# Patient Record
Sex: Female | Born: 1970 | Race: Black or African American | Hispanic: No | Marital: Married | Smoking: Never smoker
Health system: Southern US, Community
[De-identification: ages and names within clinical notes are randomized; demographics above are authoritative.]

## PROBLEM LIST (undated history)

## (undated) DIAGNOSIS — I10 Essential (primary) hypertension: Secondary | ICD-10-CM

## (undated) HISTORY — PX: REDUCTION MAMMAPLASTY: SUR839

## (undated) HISTORY — PX: ECTOPIC PREGNANCY SURGERY: SHX613

## (undated) HISTORY — PX: CHOLECYSTECTOMY: SHX55

## (undated) HISTORY — PX: ABDOMINAL SURGERY: SHX537

## (undated) HISTORY — DX: Essential (primary) hypertension: I10

---

## 2009-02-05 ENCOUNTER — Encounter: Admission: RE | Admit: 2009-02-05 | Discharge: 2009-02-05 | Payer: Self-pay | Admitting: Specialist

## 2009-02-09 ENCOUNTER — Ambulatory Visit (HOSPITAL_COMMUNITY): Admission: RE | Admit: 2009-02-09 | Discharge: 2009-02-09 | Payer: Self-pay | Admitting: Obstetrics and Gynecology

## 2010-06-12 ENCOUNTER — Encounter: Payer: Self-pay | Admitting: Obstetrics and Gynecology

## 2011-02-16 ENCOUNTER — Other Ambulatory Visit (HOSPITAL_COMMUNITY): Payer: Self-pay | Admitting: Obstetrics and Gynecology

## 2011-02-16 DIAGNOSIS — Z1231 Encounter for screening mammogram for malignant neoplasm of breast: Secondary | ICD-10-CM

## 2011-02-28 ENCOUNTER — Ambulatory Visit (HOSPITAL_COMMUNITY)
Admission: RE | Admit: 2011-02-28 | Discharge: 2011-02-28 | Disposition: A | Discharge status: Home / Self Care | Payer: 59 | Source: Ambulatory Visit | Attending: Obstetrics and Gynecology | Admitting: Obstetrics and Gynecology

## 2011-02-28 DIAGNOSIS — Z1231 Encounter for screening mammogram for malignant neoplasm of breast: Secondary | ICD-10-CM | POA: Insufficient documentation

## 2011-08-23 DIAGNOSIS — Z0000 Encounter for general adult medical examination without abnormal findings: Secondary | ICD-10-CM

## 2012-01-29 ENCOUNTER — Other Ambulatory Visit: Payer: Self-pay | Admitting: Obstetrics and Gynecology

## 2012-01-29 DIAGNOSIS — Z1231 Encounter for screening mammogram for malignant neoplasm of breast: Secondary | ICD-10-CM

## 2012-02-29 ENCOUNTER — Ambulatory Visit (HOSPITAL_COMMUNITY)
Admission: RE | Admit: 2012-02-29 | Discharge: 2012-02-29 | Disposition: A | Discharge status: Home / Self Care | Payer: 59 | Source: Ambulatory Visit | Attending: Obstetrics and Gynecology | Admitting: Obstetrics and Gynecology

## 2012-02-29 DIAGNOSIS — Z1231 Encounter for screening mammogram for malignant neoplasm of breast: Secondary | ICD-10-CM | POA: Insufficient documentation

## 2012-03-04 ENCOUNTER — Other Ambulatory Visit: Payer: Self-pay | Admitting: Obstetrics and Gynecology

## 2012-03-04 DIAGNOSIS — R928 Other abnormal and inconclusive findings on diagnostic imaging of breast: Secondary | ICD-10-CM

## 2012-03-08 ENCOUNTER — Ambulatory Visit
Admission: RE | Admit: 2012-03-08 | Discharge: 2012-03-08 | Disposition: A | Discharge status: Home / Self Care | Payer: 59 | Source: Ambulatory Visit | Attending: Obstetrics and Gynecology | Admitting: Obstetrics and Gynecology

## 2012-03-08 DIAGNOSIS — R928 Other abnormal and inconclusive findings on diagnostic imaging of breast: Secondary | ICD-10-CM

## 2013-02-17 ENCOUNTER — Other Ambulatory Visit: Payer: Self-pay | Admitting: Obstetrics and Gynecology

## 2013-02-17 DIAGNOSIS — Z1231 Encounter for screening mammogram for malignant neoplasm of breast: Secondary | ICD-10-CM

## 2013-03-10 ENCOUNTER — Ambulatory Visit (HOSPITAL_COMMUNITY): Payer: 59

## 2013-03-21 ENCOUNTER — Ambulatory Visit (HOSPITAL_COMMUNITY)
Admission: RE | Admit: 2013-03-21 | Discharge: 2013-03-21 | Disposition: A | Discharge status: Home / Self Care | Payer: 59 | Source: Ambulatory Visit | Attending: Obstetrics and Gynecology | Admitting: Obstetrics and Gynecology

## 2013-03-21 DIAGNOSIS — Z1231 Encounter for screening mammogram for malignant neoplasm of breast: Secondary | ICD-10-CM | POA: Insufficient documentation

## 2013-06-18 ENCOUNTER — Other Ambulatory Visit (HOSPITAL_COMMUNITY): Payer: Self-pay | Admitting: Orthopedic Surgery

## 2013-06-18 DIAGNOSIS — M25562 Pain in left knee: Secondary | ICD-10-CM

## 2013-07-01 ENCOUNTER — Ambulatory Visit (HOSPITAL_COMMUNITY)
Admission: RE | Admit: 2013-07-01 | Discharge: 2013-07-01 | Disposition: A | Discharge status: Home / Self Care | Payer: 59 | Source: Ambulatory Visit | Attending: Orthopedic Surgery | Admitting: Orthopedic Surgery

## 2013-07-01 DIAGNOSIS — M7120 Synovial cyst of popliteal space [Baker], unspecified knee: Secondary | ICD-10-CM | POA: Insufficient documentation

## 2013-07-01 DIAGNOSIS — M25562 Pain in left knee: Secondary | ICD-10-CM

## 2013-07-01 DIAGNOSIS — M25469 Effusion, unspecified knee: Secondary | ICD-10-CM | POA: Insufficient documentation

## 2013-07-01 DIAGNOSIS — M259 Joint disorder, unspecified: Secondary | ICD-10-CM | POA: Insufficient documentation

## 2014-02-23 ENCOUNTER — Other Ambulatory Visit (HOSPITAL_COMMUNITY): Payer: Self-pay | Admitting: Obstetrics and Gynecology

## 2014-02-23 DIAGNOSIS — Z1231 Encounter for screening mammogram for malignant neoplasm of breast: Secondary | ICD-10-CM

## 2014-03-26 ENCOUNTER — Ambulatory Visit (HOSPITAL_COMMUNITY)
Admission: RE | Admit: 2014-03-26 | Discharge: 2014-03-26 | Disposition: A | Discharge status: Home / Self Care | Payer: 59 | Source: Ambulatory Visit | Attending: Obstetrics and Gynecology | Admitting: Obstetrics and Gynecology

## 2014-03-26 DIAGNOSIS — Z1231 Encounter for screening mammogram for malignant neoplasm of breast: Secondary | ICD-10-CM | POA: Insufficient documentation

## 2015-02-23 ENCOUNTER — Other Ambulatory Visit: Payer: Self-pay

## 2015-02-23 DIAGNOSIS — Z1231 Encounter for screening mammogram for malignant neoplasm of breast: Secondary | ICD-10-CM

## 2015-03-30 ENCOUNTER — Ambulatory Visit
Admission: RE | Admit: 2015-03-30 | Discharge: 2015-03-30 | Disposition: A | Discharge status: Home / Self Care | Payer: 59 | Source: Ambulatory Visit

## 2015-03-30 DIAGNOSIS — Z1231 Encounter for screening mammogram for malignant neoplasm of breast: Secondary | ICD-10-CM

## 2015-05-25 DIAGNOSIS — E663 Overweight: Secondary | ICD-10-CM | POA: Diagnosis not present

## 2015-05-25 DIAGNOSIS — R030 Elevated blood-pressure reading, without diagnosis of hypertension: Secondary | ICD-10-CM | POA: Diagnosis not present

## 2015-05-25 DIAGNOSIS — R05 Cough: Secondary | ICD-10-CM | POA: Diagnosis not present

## 2015-07-14 DIAGNOSIS — E669 Obesity, unspecified: Secondary | ICD-10-CM | POA: Diagnosis not present

## 2015-07-14 DIAGNOSIS — M793 Panniculitis, unspecified: Secondary | ICD-10-CM | POA: Diagnosis not present

## 2016-03-14 DIAGNOSIS — Z01419 Encounter for gynecological examination (general) (routine) without abnormal findings: Secondary | ICD-10-CM | POA: Diagnosis not present

## 2016-03-14 DIAGNOSIS — Z124 Encounter for screening for malignant neoplasm of cervix: Secondary | ICD-10-CM | POA: Diagnosis not present

## 2016-03-14 DIAGNOSIS — Z6832 Body mass index (BMI) 32.0-32.9, adult: Secondary | ICD-10-CM | POA: Diagnosis not present

## 2016-03-15 DIAGNOSIS — Z124 Encounter for screening for malignant neoplasm of cervix: Secondary | ICD-10-CM | POA: Diagnosis not present

## 2016-03-23 ENCOUNTER — Other Ambulatory Visit: Payer: Self-pay | Admitting: Obstetrics and Gynecology

## 2016-03-23 DIAGNOSIS — Z1231 Encounter for screening mammogram for malignant neoplasm of breast: Secondary | ICD-10-CM

## 2016-05-01 ENCOUNTER — Ambulatory Visit: Payer: 59

## 2016-05-25 ENCOUNTER — Emergency Department (HOSPITAL_COMMUNITY)
Admission: EM | Admit: 2016-05-25 | Discharge: 2016-05-25 | Disposition: A | Discharge status: Home / Self Care | Payer: BLUE CROSS/BLUE SHIELD | Attending: Physician Assistant | Admitting: Physician Assistant

## 2016-05-25 ENCOUNTER — Encounter (HOSPITAL_COMMUNITY): Payer: Self-pay | Admitting: Emergency Medicine

## 2016-05-25 ENCOUNTER — Emergency Department (HOSPITAL_COMMUNITY): Payer: BLUE CROSS/BLUE SHIELD

## 2016-05-25 DIAGNOSIS — R51 Headache: Secondary | ICD-10-CM | POA: Diagnosis not present

## 2016-05-25 DIAGNOSIS — I159 Secondary hypertension, unspecified: Secondary | ICD-10-CM | POA: Diagnosis not present

## 2016-05-25 DIAGNOSIS — I10 Essential (primary) hypertension: Secondary | ICD-10-CM | POA: Diagnosis present

## 2016-05-25 LAB — URINALYSIS, ROUTINE W REFLEX MICROSCOPIC
Bilirubin Urine: NEGATIVE
Glucose, UA: NEGATIVE mg/dL
Hgb urine dipstick: NEGATIVE
KETONES UR: NEGATIVE mg/dL
Nitrite: NEGATIVE
PROTEIN: NEGATIVE mg/dL
Specific Gravity, Urine: 1.006 (ref 1.005–1.030)
pH: 6 (ref 5.0–8.0)

## 2016-05-25 LAB — COMPREHENSIVE METABOLIC PANEL
ALT: 33 U/L (ref 14–54)
ANION GAP: 7 (ref 5–15)
AST: 34 U/L (ref 15–41)
Albumin: 4 g/dL (ref 3.5–5.0)
Alkaline Phosphatase: 85 U/L (ref 38–126)
BUN: 9 mg/dL (ref 6–20)
CHLORIDE: 108 mmol/L (ref 101–111)
CO2: 24 mmol/L (ref 22–32)
CREATININE: 0.79 mg/dL (ref 0.44–1.00)
Calcium: 9.6 mg/dL (ref 8.9–10.3)
GFR calc Af Amer: >60 mL/min (ref >60)
GFR calc non Af Amer: >60 mL/min (ref >60)
Glucose, Bld: 87 mg/dL (ref 65–99)
Potassium: 3.9 mmol/L (ref 3.5–5.1)
SODIUM: 139 mmol/L (ref 135–145)
Total Bilirubin: 1.2 mg/dL (ref 0.3–1.2)
Total Protein: 8.6 g/dL — ABNORMAL HIGH (ref 6.5–8.1)

## 2016-05-25 LAB — CBC
HCT: 37.6 % (ref 36.0–46.0)
HEMOGLOBIN: 12.5 g/dL (ref 12.0–15.0)
MCH: 24.9 pg — AB (ref 26.0–34.0)
MCHC: 33.2 g/dL (ref 30.0–36.0)
MCV: 74.9 fL — AB (ref 78.0–100.0)
Platelets: 289 10*3/uL (ref 150–400)
RBC: 5.02 MIL/uL (ref 3.87–5.11)
RDW: 15.4 % (ref 11.5–15.5)
WBC: 3.9 10*3/uL — ABNORMAL LOW (ref 4.0–10.5)

## 2016-05-25 NOTE — ED Notes (Signed)
Mobile computer does not have sig pad, pt unable to sign dc instructions but verbalized understanding

## 2016-05-25 NOTE — ED Triage Notes (Signed)
Pt here for htn noted x 3 days; pt sts some HA at times

## 2016-05-25 NOTE — ED Notes (Signed)
Pt returned from CT °

## 2016-05-25 NOTE — ED Notes (Signed)
Patient transported to CT 

## 2016-05-25 NOTE — Discharge Instructions (Signed)
Please follow up with her primary care physician within the next 2 weeks. Please return with any chest pain, worsening headache, or other concerns.  To find a primary care or specialty doctor please call [phone] or [phone] to access "[place] Find a Doctor Service."  You may also go on the [facility] website at [email or web address]  There are also multiple [facility],  and [facility] practices throughout the [place] that are frequently accepting new patients. You may find a clinic that is close to your home and contact them.  [facility] -  [address] [place] [phone]  [facility] in [place] (also locations in [place] and [place]) -  [address] [place] [address] [phone]  [facility] -  [address] [place] [address] [phone]

## 2016-05-25 NOTE — ED Provider Notes (Signed)
[facility]-EMER[facility]ENCY DEPT Provider Note   CSN: [id number] Arrival date & time: 05/25/16  1616     History   Chief Complaint Chief Complaint  Patient presents with  . Hypertension    HPI Cheryl Roberson is a 46 y.o. female.  The history is provided by the patient.  Hypertension  This is a recurrent problem. The current episode started more than 1 week ago. The problem occurs constantly. The problem has not changed since onset.Associated symptoms include headaches. Pertinent negatives include no chest pain and no abdominal pain. Nothing aggravates the symptoms. Nothing relieves the symptoms. She has tried nothing for the symptoms. The treatment provided no relief.    History reviewed. No pertinent past medical history.  There are no active problems to display for this patient.   History reviewed. No pertinent surgical history.  OB History    No data available       Home Medications    Prior to Admission medications   Not on File    Family History History reviewed. No pertinent family history.  Social History Social History  Substance Use Topics  . Smoking status: Never Smoker  . Smokeless tobacco: Never Used  . Alcohol use No     Allergies   Patient has no known allergies.   Review of Systems Review of Systems  Respiratory: Negative for chest tightness.   Cardiovascular: Negative for chest pain.  Gastrointestinal: Negative for abdominal pain.  Neurological: Positive for headaches.  All other systems reviewed and are negative.    Physical Exam Updated Vital Signs BP 165/85   Pulse 63   Temp 98.1 F (36.7 C) (Oral)   Resp 18   Ht 5' 5.25" (1.657 m)   Wt 200 lb (90.7 kg)   SpO2 99%   BMI 33.03 kg/m   Physical Exam  Constitutional: She is oriented to person, place, and time. She appears well-developed and well-nourished.  HENT:  Head: Normocephalic and atraumatic.  Eyes: Right eye exhibits no discharge.  Cardiovascular: Normal rate, regular  rhythm and normal heart sounds.   No murmur heard. Pulmonary/Chest: Effort normal and breath sounds normal. She has no wheezes. She has no rales.  Abdominal: Soft. She exhibits no distension. There is no tenderness.  Neurological: She is oriented to person, place, and time.  Skin: Skin is warm and dry. She is not diaphoretic.  Psychiatric: She has a normal mood and affect.  Nursing note and vitals reviewed.    ED Treatments / Results  Labs (all labs ordered are listed, but only abnormal results are displayed) Labs Reviewed  COMPREHENSIVE METABOLIC PANEL - Abnormal; Notable for the following:       Result Value   Total Protein 8.6 (*)    All other components within normal limits  CBC - Abnormal; Notable for the following:    WBC 3.9 (*)    MCV 74.9 (*)    MCH 24.9 (*)    All other components within normal limits  URINALYSIS, ROUTINE W REFLEX MICROSCOPIC - Abnormal; Notable for the following:    Color, Urine STRAW (*)    Leukocytes, UA TRACE (*)    Bacteria, UA RARE (*)    Squamous Epithelial / LPF 0-5 (*)    All other components within normal limits    EKG  EKG Interpretation  Date/Time:  Thursday May 25 2016 20:14:23 EST Ventricular Rate:  57 PR Interval:  182 QRS Duration: 78 QT Interval:  416 QTC Calculation: 404 R Axis:   55  Text Interpretation:  Sinus bradycardia with sinus arrhythmia Nonspecific T wave abnormality Abnormal ECG Sinus bradycardia Confirmed by Kandis Mannan ([id number]) on 05/25/2016 8:20:10 PM       Radiology Ct Head Wo Contrast  Result Date: 05/25/2016 CLINICAL DATA:  Headache, hypertension EXAM: CT HEAD WITHOUT CONTRAST TECHNIQUE: Contiguous axial images were obtained from the base of the skull through the vertex without intravenous contrast. COMPARISON:  None. FINDINGS: Brain: No acute intracranial abnormality. Specifically, no hemorrhage, hydrocephalus, mass lesion, acute infarction, or significant intracranial injury. Vascular: No hyperdense  vessel or unexpected calcification. Skull: No acute calvarial abnormality. Sinuses/Orbits: Visualized paranasal sinuses and mastoids clear. Orbital soft tissues unremarkable. Other: None IMPRESSION: Normal study. Electronically Signed   By: Charlett Nose M.D.   On: 05/25/2016 19:59    Procedures Procedures (including critical care time)  Medications Ordered in ED Medications - No data to display   Initial Impression / Assessment and Plan / ED Course  I have reviewed the triage vital signs and the nursing notes.  Pertinent labs & imaging results that were available during my care of the patient were reviewed by me and considered in my medical decision making (see chart for details).  Clinical Course    Patient is a pleasant 46 year old female she is presenting with hypertension. Patient has noticed she started new job and they have been measuring her blood pressure at work. They noticed that she had hypertension. Patient has occasional headaches. No chest pain. Patient has not seen a physician in the prolonged period of time. She had borderline hypertension back in her 27s. Today she is hypertensive. However I worry about acutely lowering her blood pressure too much given that she is asymptomatic. She has recorded blood pressures from a week ago. According to guidelines patient will require another elevated blood pressure in 2 weeks in order to start her on antihypertensives. Patient is getting her insurance sorted out in feels that she will be able to get in with a primary care physician of the couple weeks.     Final Clinical Impressions(s) / ED Diagnoses   Final diagnoses:  Secondary hypertension    New Prescriptions There are no discharge medications for this patient.    Aerial Dilley Randall An, MD 05/25/16 2224

## 2016-06-26 ENCOUNTER — Ambulatory Visit
Admission: RE | Admit: 2016-06-26 | Discharge: 2016-06-26 | Disposition: A | Discharge status: Home / Self Care | Payer: BLUE CROSS/BLUE SHIELD | Source: Ambulatory Visit | Attending: Obstetrics and Gynecology | Admitting: Obstetrics and Gynecology

## 2016-06-26 DIAGNOSIS — Z1231 Encounter for screening mammogram for malignant neoplasm of breast: Secondary | ICD-10-CM

## 2017-06-06 ENCOUNTER — Other Ambulatory Visit: Payer: Self-pay | Admitting: Obstetrics and Gynecology

## 2017-06-06 DIAGNOSIS — Z1231 Encounter for screening mammogram for malignant neoplasm of breast: Secondary | ICD-10-CM

## 2017-06-28 ENCOUNTER — Ambulatory Visit
Admission: RE | Admit: 2017-06-28 | Discharge: 2017-06-28 | Disposition: A | Discharge status: Home / Self Care | Payer: BLUE CROSS/BLUE SHIELD | Source: Ambulatory Visit | Attending: Obstetrics and Gynecology | Admitting: Obstetrics and Gynecology

## 2017-06-28 DIAGNOSIS — Z1231 Encounter for screening mammogram for malignant neoplasm of breast: Secondary | ICD-10-CM

## 2018-05-31 ENCOUNTER — Other Ambulatory Visit: Payer: Self-pay | Admitting: Obstetrics and Gynecology

## 2018-05-31 DIAGNOSIS — Z1231 Encounter for screening mammogram for malignant neoplasm of breast: Secondary | ICD-10-CM

## 2018-07-01 ENCOUNTER — Ambulatory Visit
Admission: RE | Admit: 2018-07-01 | Discharge: 2018-07-01 | Disposition: A | Discharge status: Home / Self Care | Payer: BLUE CROSS/BLUE SHIELD | Source: Ambulatory Visit | Attending: Obstetrics and Gynecology | Admitting: Obstetrics and Gynecology

## 2018-07-01 DIAGNOSIS — Z1231 Encounter for screening mammogram for malignant neoplasm of breast: Secondary | ICD-10-CM

## 2018-07-17 ENCOUNTER — Encounter: Payer: Self-pay | Admitting: Gastroenterology

## 2018-08-16 ENCOUNTER — Encounter: Payer: BLUE CROSS/BLUE SHIELD | Admitting: Gastroenterology

## 2018-09-11 ENCOUNTER — Encounter: Payer: BLUE CROSS/BLUE SHIELD | Admitting: Gastroenterology

## 2018-09-20 DIAGNOSIS — Z8619 Personal history of other infectious and parasitic diseases: Secondary | ICD-10-CM

## 2018-09-20 HISTORY — DX: Personal history of other infectious and parasitic diseases: Z86.19

## 2018-09-24 ENCOUNTER — Telehealth: Payer: Self-pay | Admitting: *Deleted

## 2018-09-24 NOTE — Telephone Encounter (Signed)
Ok to schedule procedure per Dr. Barron Alvine.  Pt states she would like to call back next week to schedule both her previsit and colonoscopy

## 2018-10-09 DIAGNOSIS — M25561 Pain in right knee: Secondary | ICD-10-CM | POA: Insufficient documentation

## 2018-11-19 ENCOUNTER — Encounter: Payer: Self-pay | Admitting: Gastroenterology

## 2019-05-22 ENCOUNTER — Encounter: Payer: Self-pay | Admitting: Gastroenterology

## 2019-05-30 ENCOUNTER — Other Ambulatory Visit: Payer: Self-pay | Admitting: Obstetrics and Gynecology

## 2019-05-30 DIAGNOSIS — Z1231 Encounter for screening mammogram for malignant neoplasm of breast: Secondary | ICD-10-CM

## 2019-06-11 ENCOUNTER — Other Ambulatory Visit: Payer: Self-pay

## 2019-06-11 ENCOUNTER — Ambulatory Visit (AMBULATORY_SURGERY_CENTER): Payer: Self-pay | Admitting: *Deleted

## 2019-06-11 VITALS — Temp 97.5°F | Ht 65.25 in | Wt 224.0 lb

## 2019-06-11 DIAGNOSIS — Z01818 Encounter for other preprocedural examination: Secondary | ICD-10-CM

## 2019-06-11 DIAGNOSIS — Z1211 Encounter for screening for malignant neoplasm of colon: Secondary | ICD-10-CM

## 2019-06-11 MED ORDER — CLENPIQ 10-3.5-12 MG-GM -GM/160ML PO SOLN: 1.0000 | ORAL | 0 refills | Status: DC

## 2019-06-11 NOTE — Progress Notes (Signed)
Patient is here in-person for PV. Patient denies any allergies to eggs or soy. Patient denies any problems with anesthesia/sedation. Patient denies any oxygen use at home. Patient denies taking any diet/weight loss medications or blood thinners. Patient is not being treated for MRSA or C-diff. EMMI education assisgned to the patient for the procedure, this was explained and instructions given to patient. COVID-19 screening test is on 1/29, the pt is aware. Pt is aware that care partner will wait in the car during procedure; if they feel like they will be too hot or cold to wait in the car; they may wait in the 4 th floor lobby. Patient is aware to bring only one care partner. We want them to wear a mask (we do not have any that we can provide them), practice social distancing, and we will check their temperatures when they get here.  I did remind the patient that their care partner needs to stay in the parking lot the entire time and have a cell phone available, we will call them when the pt is ready for discharge. Patient will wear mask into building.    Clenpiq coupon given to the patient.

## 2019-06-18 ENCOUNTER — Encounter: Payer: Self-pay | Admitting: Gastroenterology

## 2019-06-20 ENCOUNTER — Other Ambulatory Visit: Payer: Self-pay

## 2019-06-20 ENCOUNTER — Other Ambulatory Visit: Payer: Self-pay | Admitting: Gastroenterology

## 2019-06-20 ENCOUNTER — Ambulatory Visit (INDEPENDENT_AMBULATORY_CARE_PROVIDER_SITE_OTHER): Payer: Self-pay

## 2019-06-20 DIAGNOSIS — Z1159 Encounter for screening for other viral diseases: Secondary | ICD-10-CM

## 2019-06-23 LAB — SARS CORONAVIRUS 2 (TAT 6-24 HRS): SARS Coronavirus 2: NEGATIVE

## 2019-06-25 ENCOUNTER — Encounter: Payer: Self-pay | Admitting: Gastroenterology

## 2019-06-25 ENCOUNTER — Ambulatory Visit (AMBULATORY_SURGERY_CENTER): Payer: BC Managed Care – PPO | Admitting: Gastroenterology

## 2019-06-25 ENCOUNTER — Other Ambulatory Visit: Payer: Self-pay

## 2019-06-25 VITALS — BP 116/80 | HR 54 | Temp 97.1°F | Resp 19 | Ht 65.25 in | Wt 224.0 lb

## 2019-06-25 DIAGNOSIS — Z1211 Encounter for screening for malignant neoplasm of colon: Secondary | ICD-10-CM

## 2019-06-25 DIAGNOSIS — K640 First degree hemorrhoids: Secondary | ICD-10-CM

## 2019-06-25 MED ORDER — SODIUM CHLORIDE 0.9 % IV SOLN: 500.0000 mL | Freq: Once | INTRAVENOUS | Status: DC

## 2019-06-25 NOTE — Progress Notes (Signed)
Report given to PACU, vss 

## 2019-06-25 NOTE — Progress Notes (Signed)
Temp-JB VS-CW  Pt's states no medical or surgical changes since previsit or office visit.  

## 2019-06-25 NOTE — Patient Instructions (Signed)
Information on hemorrhoids given to you today.  Use fiber supplement such as Citrucel, Metamucil, Fibercon or Konsyl.  Repeat colonoscopy in 10 years.  YOU HAD AN ENDOSCOPIC PROCEDURE TODAY AT THE  ENDOSCOPY CENTER:   Refer to the procedure report that was given to you for any specific questions about what was found during the examination.  If the procedure report does not answer your questions, please call your gastroenterologist to clarify.  If you requested that your care partner not be given the details of your procedure findings, then the procedure report has been included in a sealed envelope for you to review at your convenience later.  YOU SHOULD EXPECT: Some feelings of bloating in the abdomen. Passage of more gas than usual.  Walking can help get rid of the air that was put into your GI tract during the procedure and reduce the bloating. If you had a lower endoscopy (such as a colonoscopy or flexible sigmoidoscopy) you may notice spotting of blood in your stool or on the toilet paper. If you underwent a bowel prep for your procedure, you may not have a normal bowel movement for a few days.  Please Note:  You might notice some irritation and congestion in your nose or some drainage.  This is from the oxygen used during your procedure.  There is no need for concern and it should clear up in a day or so.  SYMPTOMS TO REPORT IMMEDIATELY:   Following lower endoscopy (colonoscopy or flexible sigmoidoscopy):  Excessive amounts of blood in the stool  Significant tenderness or worsening of abdominal pains  Swelling of the abdomen that is new, acute  Fever of 100F or higher  For urgent or emergent issues, a gastroenterologist can be reached at any hour by calling [phone].   DIET:  We do recommend a small meal at first, but then you may proceed to your regular diet.  Drink plenty of fluids but you should avoid alcoholic beverages for 24 hours.  ACTIVITY:  You should plan to  take it easy for the rest of today and you should NOT DRIVE or use heavy machinery until tomorrow (because of the sedation medicines used during the test).    FOLLOW UP: Our staff will call the number listed on your records 48-72 hours following your procedure to check on you and address any questions or concerns that you may have regarding the information given to you following your procedure. If we do not reach you, we will leave a message.  We will attempt to reach you two times.  During this call, we will ask if you have developed any symptoms of COVID 19. If you develop any symptoms (ie: fever, flu-like symptoms, shortness of breath, cough etc.) before then, please call [phone].  If you test positive for Covid 19 in the 2 weeks post procedure, please call and report this information to [place].    If any biopsies were taken you will be contacted by phone or by letter within the next 1-3 weeks.  Please call us at [phone] if you have not heard about the biopsies in 3 weeks.    SIGNATURES/CONFIDENTIALITY: You and/or your care partner have signed paperwork which will be entered into your electronic medical record.  These signatures attest to the fact that that the information above on your After Visit Summary has been reviewed and is understood.  Full responsibility of the confidentiality of this discharge information lies with you and/or your care-partner.

## 2019-06-25 NOTE — Op Note (Signed)
[facility] Patient Name: Cheryl Roberson Procedure Date: 06/25/2019 8:53 AM MRN: [id number] Endoscopist: Doristine Locks , MD Age: 49 Referring MD:  Date of Birth: 09/26/1970 Gender: Female Account #: [id number] Procedure:                Colonoscopy Indications:              Screening for colorectal malignant neoplasm, This                            is the patient's first colonoscopy Medicines:                Monitored Anesthesia Care Procedure:                Pre-Anesthesia Assessment:                           - Prior to the procedure, a History and Physical                            was performed, and patient medications and                            allergies were reviewed. The patient's tolerance of                            previous anesthesia was also reviewed. The risks                            and benefits of the procedure and the sedation                            options and risks were discussed with the patient.                            All questions were answered, and informed consent                            was obtained. Prior Anticoagulants: The patient has                            taken no previous anticoagulant or antiplatelet                            agents. ASA Grade Assessment: II - A patient with                            mild systemic disease. After reviewing the risks                            and benefits, the patient was deemed in                            satisfactory condition to undergo the procedure.  After obtaining informed consent, the colonoscope                            was passed under direct vision. Throughout the                            procedure, the patient's blood pressure, pulse, and                            oxygen saturations were monitored continuously. The                            Colonoscope was introduced through the anus and                            advanced to the the cecum,  identified by                            appendiceal orifice and ileocecal valve. The                            colonoscopy was technically difficult and complex                            due to significant looping. Successful completion                            of the procedure was aided by using manual                            pressure. The patient tolerated the procedure well.                            The quality of the bowel preparation was adequate.                            The ileocecal valve, appendiceal orifice, and                            rectum were photographed. Scope In: 9:05:08 AM Scope Out: 9:21:56 AM Scope Withdrawal Time: 0 hours 11 minutes 5 seconds  Total Procedure Duration: 0 hours 16 minutes 48 seconds  Findings:                 The perianal and digital rectal examinations were                            normal.                           The colon appeared normal.                           Non-bleeding internal hemorrhoids were found during  retroflexion. The hemorrhoids were small.                           The ascending colon revealed moderately excessive                            looping. Advancing the scope required using manual                            pressure. Complications:            No immediate complications. Estimated Blood Loss:     Estimated blood loss: none. Impression:               - The entire examined colon is normal.                           - Non-bleeding internal hemorrhoids.                           - There was significant looping of the colon.                           - No specimens collected. Recommendation:           - Patient has a contact number available for                            emergencies. The signs and symptoms of potential                            delayed complications were discussed with the                            patient. Return to normal activities tomorrow.                             Written discharge instructions were provided to the                            patient.                           - Resume previous diet.                           - Continue present medications.                           - Repeat colonoscopy in 10 years for screening                            purposes.                           - Return to GI office PRN.                           -  Use fiber, for example Citrucel, Fibercon, Konsyl                            or Metamucil. Doristine Locks, MD 06/25/2019 9:26:29 AM

## 2019-06-27 ENCOUNTER — Telehealth: Payer: Self-pay | Admitting: *Deleted

## 2019-06-27 NOTE — Telephone Encounter (Signed)
  Follow up Call-  Call back number 06/25/2019  Post procedure Call Back phone  # [phone]  Permission to leave phone message Yes  Some recent data might be hidden     Patient questions:  Do you have a fever, pain , or abdominal swelling? No. Pain Score  0 *  Have you tolerated food without any problems? Yes.    Have you been able to return to your normal activities? Yes.    Do you have any questions about your discharge instructions: Diet   No. Medications  No. Follow up visit  No.  Do you have questions or concerns about your Care? No.  Actions: * If pain score is 4 or above: No action needed, pain <4.  1. Have you developed a fever since your procedure? no  2.   Have you had an respiratory symptoms (SOB or cough) since your procedure? no  3.   Have you tested positive for COVID 19 since your procedure no  4.   Have you had any family members/close contacts diagnosed with the COVID 19 since your procedure?  no   If yes to any of these questions please route to Laverna Peace, RN and Jennye Boroughs, Charity fundraiser.

## 2019-07-08 ENCOUNTER — Ambulatory Visit
Admission: RE | Admit: 2019-07-08 | Discharge: 2019-07-08 | Disposition: A | Discharge status: Home / Self Care | Payer: BC Managed Care – PPO | Source: Ambulatory Visit | Attending: Obstetrics and Gynecology | Admitting: Obstetrics and Gynecology

## 2019-07-08 ENCOUNTER — Other Ambulatory Visit: Payer: Self-pay

## 2019-07-08 DIAGNOSIS — Z1231 Encounter for screening mammogram for malignant neoplasm of breast: Secondary | ICD-10-CM

## 2019-08-26 ENCOUNTER — Other Ambulatory Visit: Payer: BC Managed Care – PPO

## 2020-07-14 ENCOUNTER — Other Ambulatory Visit: Payer: Self-pay | Admitting: Obstetrics and Gynecology

## 2020-07-14 DIAGNOSIS — Z1231 Encounter for screening mammogram for malignant neoplasm of breast: Secondary | ICD-10-CM

## 2020-08-03 ENCOUNTER — Other Ambulatory Visit (HOSPITAL_COMMUNITY): Payer: Self-pay | Admitting: *Deleted

## 2020-09-03 ENCOUNTER — Ambulatory Visit: Payer: BC Managed Care – PPO

## 2020-09-06 ENCOUNTER — Ambulatory Visit
Admission: RE | Admit: 2020-09-06 | Discharge: 2020-09-06 | Disposition: A | Discharge status: Home / Self Care | Payer: BC Managed Care – PPO | Source: Ambulatory Visit | Attending: Obstetrics and Gynecology | Admitting: Obstetrics and Gynecology

## 2020-09-06 ENCOUNTER — Other Ambulatory Visit: Payer: Self-pay

## 2020-09-06 DIAGNOSIS — Z1231 Encounter for screening mammogram for malignant neoplasm of breast: Secondary | ICD-10-CM

## 2020-10-05 ENCOUNTER — Other Ambulatory Visit (HOSPITAL_COMMUNITY): Payer: Self-pay

## 2020-10-05 MED FILL — Phentermine HCl Tab 37.5 MG: ORAL | 30 days supply | Qty: 30 | Fill #0 | Status: AC

## 2020-10-06 ENCOUNTER — Other Ambulatory Visit (HOSPITAL_COMMUNITY): Payer: Self-pay

## 2020-10-21 ENCOUNTER — Other Ambulatory Visit (HOSPITAL_COMMUNITY): Payer: Self-pay

## 2020-10-21 MED ORDER — SULFAMETHOXAZOLE-TRIMETHOPRIM 800-160 MG PO TABS
1.0000 | ORAL_TABLET | Freq: Two times a day (BID) | ORAL | 0 refills | Status: AC
  Filled 2020-10-21: qty 6, 3d supply, fill #0

## 2020-12-13 ENCOUNTER — Other Ambulatory Visit (HOSPITAL_COMMUNITY): Payer: Self-pay

## 2020-12-13 MED ORDER — INDOMETHACIN 50 MG PO CAPS
50.0000 mg | ORAL_CAPSULE | Freq: Three times a day (TID) | ORAL | 0 refills | Status: AC | PRN
  Filled 2020-12-13: qty 10, 3d supply, fill #0
  Filled 2020-12-13: qty 20, 7d supply, fill #0

## 2020-12-13 MED ORDER — CEPHALEXIN 500 MG PO CAPS
500.0000 mg | ORAL_CAPSULE | Freq: Three times a day (TID) | ORAL | 0 refills | Status: AC
  Filled 2020-12-13: qty 21, 7d supply, fill #0

## 2020-12-13 MED ORDER — PREDNISONE 20 MG PO TABS
40.0000 mg | ORAL_TABLET | Freq: Every day | ORAL | 0 refills | Status: AC
  Filled 2020-12-13: qty 12, 6d supply, fill #0

## 2020-12-14 ENCOUNTER — Other Ambulatory Visit (HOSPITAL_COMMUNITY): Payer: Self-pay

## 2021-02-02 ENCOUNTER — Other Ambulatory Visit (HOSPITAL_COMMUNITY): Payer: Self-pay

## 2021-02-03 ENCOUNTER — Other Ambulatory Visit (HOSPITAL_COMMUNITY): Payer: Self-pay

## 2021-02-04 ENCOUNTER — Other Ambulatory Visit (HOSPITAL_COMMUNITY): Payer: Self-pay

## 2021-02-07 ENCOUNTER — Other Ambulatory Visit (HOSPITAL_COMMUNITY): Payer: Self-pay

## 2021-02-07 MED ORDER — PHENTERMINE HCL 37.5 MG PO TABS
37.5000 mg | ORAL_TABLET | Freq: Every day | ORAL | 0 refills | Status: AC
  Filled 2021-02-07: qty 30, 30d supply, fill #0

## 2021-08-08 ENCOUNTER — Other Ambulatory Visit: Payer: Self-pay | Admitting: Obstetrics and Gynecology

## 2021-08-16 ENCOUNTER — Other Ambulatory Visit: Payer: Self-pay

## 2021-08-16 DIAGNOSIS — Z1231 Encounter for screening mammogram for malignant neoplasm of breast: Secondary | ICD-10-CM

## 2021-08-23 ENCOUNTER — Other Ambulatory Visit: Payer: Self-pay

## 2021-08-23 DIAGNOSIS — Z1231 Encounter for screening mammogram for malignant neoplasm of breast: Secondary | ICD-10-CM

## 2021-09-08 ENCOUNTER — Ambulatory Visit
Admission: RE | Admit: 2021-09-08 | Discharge: 2021-09-08 | Disposition: A | Discharge status: Home / Self Care | Payer: No Typology Code available for payment source | Source: Ambulatory Visit | Attending: Obstetrics and Gynecology | Admitting: Obstetrics and Gynecology

## 2021-09-08 DIAGNOSIS — Z1231 Encounter for screening mammogram for malignant neoplasm of breast: Secondary | ICD-10-CM

## 2021-11-30 ENCOUNTER — Ambulatory Visit: Payer: BC Managed Care – PPO

## 2021-12-14 ENCOUNTER — Emergency Department (HOSPITAL_COMMUNITY): Payer: No Typology Code available for payment source

## 2021-12-14 ENCOUNTER — Encounter (HOSPITAL_COMMUNITY): Payer: Self-pay | Admitting: Emergency Medicine

## 2021-12-14 ENCOUNTER — Emergency Department (HOSPITAL_COMMUNITY)
Admission: EM | Admit: 2021-12-14 | Discharge: 2021-12-14 | Discharge status: Against Medical Advice | Payer: No Typology Code available for payment source | Attending: Emergency Medicine | Admitting: Emergency Medicine

## 2021-12-14 DIAGNOSIS — R079 Chest pain, unspecified: Secondary | ICD-10-CM | POA: Insufficient documentation

## 2021-12-14 DIAGNOSIS — Z5321 Procedure and treatment not carried out due to patient leaving prior to being seen by health care provider: Secondary | ICD-10-CM | POA: Insufficient documentation

## 2021-12-14 LAB — BASIC METABOLIC PANEL
Anion gap: 7 (ref 5–15)
BUN: 12 mg/dL (ref 6–20)
CO2: 24 mmol/L (ref 22–32)
Calcium: 9.3 mg/dL (ref 8.9–10.3)
Chloride: 107 mmol/L (ref 98–111)
Creatinine, Ser: 0.8 mg/dL (ref 0.44–1.00)
GFR, Estimated: >60 mL/min (ref >60)
Glucose, Bld: 119 mg/dL — ABNORMAL HIGH (ref 70–99)
Potassium: 3.2 mmol/L — ABNORMAL LOW (ref 3.5–5.1)
Sodium: 138 mmol/L (ref 135–145)

## 2021-12-14 LAB — CBC
HCT: 36.4 % (ref 36.0–46.0)
Hemoglobin: 12.2 g/dL (ref 12.0–15.0)
MCH: 27.4 pg (ref 26.0–34.0)
MCHC: 33.5 g/dL (ref 30.0–36.0)
MCV: 81.6 fL (ref 80.0–100.0)
Platelets: 285 10*3/uL (ref 150–400)
RBC: 4.46 MIL/uL (ref 3.87–5.11)
RDW: 14.3 % (ref 11.5–15.5)
WBC: 3.8 10*3/uL — ABNORMAL LOW (ref 4.0–10.5)
nRBC: 0 % (ref 0.0–0.2)

## 2021-12-14 LAB — TROPONIN I (HIGH SENSITIVITY): Troponin I (High Sensitivity): 2 ng/L (ref <18)

## 2021-12-14 NOTE — ED Provider Triage Note (Signed)
Emergency Medicine Provider Triage Evaluation Note  Del Wiseman , a 51 y.o. female  was evaluated in triage.  Pt complains of chest pain.  States that same feels like a pressure located in the left side of her chest.  Does not radiate.  Denies any history of heart problems.  Denies fevers, chills, shortness of breath, nausea, or vomiting.  Review of Systems  Positive:  Negative:  Physical Exam  BP 116/79   Pulse 77   Temp 98.9 F (37.2 C) (Oral)   Resp 18   LMP 04/21/2018   SpO2 100%  Gen:   Awake, no distress   Resp:  Normal effort  MSK:   Moves extremities without difficulty Other:    Medical Decision Making  Medically screening exam initiated at 6:03 PM.  Appropriate orders placed.  [name] was informed that the remainder of the evaluation will be completed by another provider, this initial triage assessment does not replace that evaluation, and the importance of remaining in the ED until their evaluation is complete.  Work-up initiated   Vear Clock 12/14/21 1804

## 2021-12-14 NOTE — ED Notes (Signed)
Patient states the wait is too long and she is leaving 

## 2021-12-14 NOTE — ED Triage Notes (Signed)
Patient here with complaint of chest pressure that started last night and persisted into today. Patient denies shortness of breath, reports feelings lightheaded. Patient is alert, oriented, and in no apparent distress at this time.

## 2022-09-04 ENCOUNTER — Other Ambulatory Visit: Payer: Self-pay | Admitting: *Deleted

## 2022-09-04 DIAGNOSIS — Z1231 Encounter for screening mammogram for malignant neoplasm of breast: Secondary | ICD-10-CM

## 2022-10-11 ENCOUNTER — Ambulatory Visit
Admission: RE | Admit: 2022-10-11 | Discharge: 2022-10-11 | Disposition: A | Discharge status: Home / Self Care | Payer: Commercial Managed Care - HMO | Source: Ambulatory Visit | Attending: *Deleted | Admitting: *Deleted

## 2022-10-11 DIAGNOSIS — Z1231 Encounter for screening mammogram for malignant neoplasm of breast: Secondary | ICD-10-CM

## 2022-11-13 DIAGNOSIS — M25571 Pain in right ankle and joints of right foot: Secondary | ICD-10-CM | POA: Diagnosis not present

## 2022-11-13 DIAGNOSIS — M67962 Unspecified disorder of synovium and tendon, left lower leg: Secondary | ICD-10-CM | POA: Diagnosis not present

## 2022-11-13 DIAGNOSIS — M25572 Pain in left ankle and joints of left foot: Secondary | ICD-10-CM | POA: Diagnosis not present

## 2022-11-13 DIAGNOSIS — M67961 Unspecified disorder of synovium and tendon, right lower leg: Secondary | ICD-10-CM | POA: Diagnosis not present

## 2022-12-25 DIAGNOSIS — M67961 Unspecified disorder of synovium and tendon, right lower leg: Secondary | ICD-10-CM | POA: Diagnosis not present

## 2022-12-25 DIAGNOSIS — M67962 Unspecified disorder of synovium and tendon, left lower leg: Secondary | ICD-10-CM | POA: Diagnosis not present

## 2022-12-26 DIAGNOSIS — M76821 Posterior tibial tendinitis, right leg: Secondary | ICD-10-CM | POA: Diagnosis not present

## 2022-12-26 DIAGNOSIS — M76822 Posterior tibial tendinitis, left leg: Secondary | ICD-10-CM | POA: Diagnosis not present

## 2023-04-03 DIAGNOSIS — Z01419 Encounter for gynecological examination (general) (routine) without abnormal findings: Secondary | ICD-10-CM | POA: Diagnosis not present

## 2023-04-03 DIAGNOSIS — Z0000 Encounter for general adult medical examination without abnormal findings: Secondary | ICD-10-CM | POA: Diagnosis not present

## 2023-04-03 DIAGNOSIS — Z780 Asymptomatic menopausal state: Secondary | ICD-10-CM | POA: Diagnosis not present

## 2023-04-03 DIAGNOSIS — I87309 Chronic venous hypertension (idiopathic) without complications of unspecified lower extremity: Secondary | ICD-10-CM | POA: Diagnosis not present

## 2023-04-03 DIAGNOSIS — N951 Menopausal and female climacteric states: Secondary | ICD-10-CM | POA: Diagnosis not present

## 2023-04-06 DIAGNOSIS — E669 Obesity, unspecified: Secondary | ICD-10-CM | POA: Diagnosis not present

## 2023-04-06 DIAGNOSIS — I10 Essential (primary) hypertension: Secondary | ICD-10-CM | POA: Diagnosis not present

## 2023-04-06 DIAGNOSIS — R002 Palpitations: Secondary | ICD-10-CM | POA: Diagnosis not present

## 2023-04-06 DIAGNOSIS — R0789 Other chest pain: Secondary | ICD-10-CM | POA: Diagnosis not present

## 2023-06-15 ENCOUNTER — Encounter: Payer: Self-pay | Admitting: *Deleted

## 2023-06-15 ENCOUNTER — Encounter: Payer: Self-pay | Admitting: Cardiology

## 2023-06-15 ENCOUNTER — Encounter (HOSPITAL_COMMUNITY): Payer: Self-pay

## 2023-06-15 ENCOUNTER — Ambulatory Visit: Payer: Medicaid Other | Attending: Cardiology | Admitting: Cardiology

## 2023-06-15 VITALS — BP 118/70 | HR 66 | Resp 17 | Ht 65.0 in | Wt 218.0 lb

## 2023-06-15 DIAGNOSIS — R002 Palpitations: Secondary | ICD-10-CM | POA: Insufficient documentation

## 2023-06-15 DIAGNOSIS — Z8249 Family history of ischemic heart disease and other diseases of the circulatory system: Secondary | ICD-10-CM | POA: Diagnosis not present

## 2023-06-15 DIAGNOSIS — R072 Precordial pain: Secondary | ICD-10-CM | POA: Diagnosis not present

## 2023-06-15 DIAGNOSIS — I10 Essential (primary) hypertension: Secondary | ICD-10-CM | POA: Insufficient documentation

## 2023-06-15 LAB — LIPID PANEL
Chol/HDL Ratio: 3.5 {ratio} (ref 0.0–4.4)
Cholesterol, Total: 189 mg/dL (ref 100–199)
HDL: 54 mg/dL (ref >39)
LDL Chol Calc (NIH): 119 mg/dL — ABNORMAL HIGH (ref 0–99)
Triglycerides: 89 mg/dL (ref 0–149)
VLDL Cholesterol Cal: 16 mg/dL (ref 5–40)

## 2023-06-15 NOTE — Patient Instructions (Signed)
Medication Instructions:   STOP TAKING LISINOPRIL-hydrochlorothiazide NOW  *If you need a refill on your cardiac medications before your next appointment, please call your pharmacy*   Lab Work:  TODAY--DOWNSTAIRS FIRST FLOOR AT [facility]--BMET, CBC AND LIPIDS  If you have labs (blood work) drawn today and your tests are completely normal, you will receive your results only by: MyChart Message (if you have MyChart) OR A paper copy in the mail If you have any lab test that is abnormal or we need to change your treatment, we will call you to review the results.   Testing/Procedures:  Your physician has requested that you have an echocardiogram. Echocardiography is a painless test that uses sound waves to create images of your heart. It provides your doctor with information about the size and shape of your heart and how well your heart's chambers and valves are working. This procedure takes approximately one hour. There are no restrictions for this procedure. Please do NOT wear cologne, perfume, aftershave, or lotions (deodorant is allowed). Please arrive 15 minutes prior to your appointment time.  Please note: We ask at that you not bring children with you during ultrasound (echo/ vascular) testing. Due to room size and safety concerns, children are not allowed in the ultrasound rooms during exams. Our front office staff cannot provide observation of children in our lobby area while testing is being conducted. An adult accompanying a patient to their appointment will only be allowed in the ultrasound room at the discretion of the ultrasound technician under special circumstances. We apologize for any inconvenience.   Your physician has requested that you have en exercise stress myoview. For further information please visit [email or web address] Please follow instruction sheet, as given.   CARDIAC CALCIUM SCORE (SELF PAY)    Follow-Up:  AS NEEDED WITH DR. PATWARDHAN

## 2023-06-15 NOTE — Progress Notes (Signed)
Cardiology Office Note:  .   Date:  06/15/2023  ID:  Cheryl Roberson, DOB 10-06-1970, MRN [id number] PCP: Elie Confer, NP  [facility] Providers Cardiologist:  Truett Mainland, MD PCP: Elie Confer, NP  Chief Complaint  Patient presents with   Palpitations   New Patient (Initial Visit)      History of Present Illness: .    Cheryl Roberson is a 53 y.o. female with hypertension, obesity, family h/o early CAD, referred for chest pain.  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 The pain, which was occurring daily, would last for about 20-30 minutes before subsiding. The patient has been managing the pain with naproxen as needed. She has a family history of heart disease, with both parents having had heart conditions.  The patient has been prescribed lisinopril and hydrochlorothiazide for high blood pressure, but has been taking the medication irregularly, about every three days, based on her mood and anxiety levels. She reports that her blood pressure has been well-controlled, even without daily medication.  The patient denies smoking and drinks caffeine daily in the form of cold coffee or tea. She also reports occasional alcohol consumption. She has been experiencing 'Charlie horses' and vein discomfort in her legs, particularly when lying in bed.  Patient denies any specific palpitation symptoms to me.  Vitals:   06/15/23 0855  BP: 118/70  Pulse: 66  Resp: 17  SpO2: 98%     ROS:  Review of Systems  Cardiovascular:  Positive for chest pain. Negative for dyspnea on exertion, leg swelling, palpitations and syncope.  Endocrine: Negative for polydipsia.  Musculoskeletal:        Leg pain     Studies Reviewed: Marland Kitchen        EKG 06/15/2023: Normal sinus rhythm Nonspecific T wave abnormality When compared with ECG of 14-Dec-2021  17:23, No significant change was found    No recent labs available.   Physical Exam:   Physical Exam Vitals and nursing note reviewed.  Constitutional:      General: She is not in acute distress. Neck:     Vascular: No JVD.  Cardiovascular:     Rate and Rhythm: Normal rate and regular rhythm.     Heart sounds: Murmur heard.     Harsh midsystolic murmur is present with a grade of 1/6 at the upper right sternal border radiating to the neck.  Pulmonary:     Effort: Pulmonary effort is normal.     Breath sounds: Normal breath sounds. No wheezing or rales.  Musculoskeletal:     Right lower leg: No edema.     Left lower leg: No edema.  Skin:    Comments: Varicose veins Lt>Rt thigh      VISIT DIAGNOSES:   ICD-10-CM   1. Palpitations  R00.2 EKG 12-Lead       ASSESSMENT AND PLAN: .    Cheryl Roberson is a 53 y.o. female with hypertension, obesity, family h/o early CAD, referred for chest pain.  Chest pain: No specific exertional component.  However, she has risk factors with family history of early CAD. Recommend exercise nuclear stress test, CT cardiac scoring, echocardiogram.  Hypertension: She has been taking lisinopril-hydrochlorothiazide regularly, last took 3 days ago.  Blood pressure is 110/70 mmHg.  Stop this medication and monitor blood pressure regularly at home.  If it stays <130/80 mmHg, she does not  need antihypertensive therapy.  If it stays higher than that, she will touch base with the PCP to resume the medication or individual lisinopril or hydrochlorothiazide.  Ordered CBC, BMP, lipid panel for baseline  Leg pain: Normal bilateral pulses.  She does have varicose veins on her thighs.  Recommend leg elevation, regular usage of compression stockings.  If symptoms do not improve in 3 months, I would encourage her to talk to her PCP regarding referring her to pain specialist.  Unless any significant abnormalities found on above testing, I will see her as  needed    Informed Consent   Shared Decision Making/Informed Consent The risks [chest pain, shortness of breath, cardiac arrhythmias, dizziness, blood pressure fluctuations, myocardial infarction, stroke/transient ischemic attack, nausea, vomiting, allergic reaction, radiation exposure, metallic taste sensation and life-threatening complications (estimated to be 1 in 10,000)], benefits (risk stratification, diagnosing coronary artery disease, treatment guidance) and alternatives of a nuclear stress test were discussed in detail with Cheryl Roberson and she agrees to proceed.       Signed, Elder Negus, MD

## 2023-06-16 LAB — CBC
Hematocrit: 37.9 % (ref 34.0–46.6)
Hemoglobin: 12.4 g/dL (ref 11.1–15.9)
MCH: 26.7 pg (ref 26.6–33.0)
MCHC: 32.7 g/dL (ref 31.5–35.7)
MCV: 82 fL (ref 79–97)
Platelets: 289 10*3/uL (ref 150–450)
RBC: 4.65 x10E6/uL (ref 3.77–5.28)
RDW: 14.5 % (ref 11.7–15.4)
WBC: 3.2 10*3/uL — ABNORMAL LOW (ref 3.4–10.8)

## 2023-06-18 ENCOUNTER — Encounter: Payer: Self-pay | Admitting: Cardiology

## 2023-06-19 ENCOUNTER — Telehealth (HOSPITAL_COMMUNITY): Payer: Self-pay | Admitting: *Deleted

## 2023-06-19 LAB — BASIC METABOLIC PANEL
BUN/Creatinine Ratio: 14 (ref 9–23)
BUN: 11 mg/dL (ref 6–24)
CO2: 22 mmol/L (ref 20–29)
Calcium: 9.4 mg/dL (ref 8.7–10.2)
Chloride: 104 mmol/L (ref 96–106)
Creatinine, Ser: 0.78 mg/dL (ref 0.57–1.00)
Glucose: 97 mg/dL (ref 70–99)
Potassium: 4 mmol/L (ref 3.5–5.2)
Sodium: 141 mmol/L (ref 134–144)
eGFR: 90 mL/min/{1.73_m2} (ref >59)

## 2023-06-19 NOTE — Telephone Encounter (Signed)
Gave pt instructions for MPI study.

## 2023-06-21 ENCOUNTER — Ambulatory Visit (HOSPITAL_COMMUNITY): Payer: Medicaid Other | Attending: Cardiology

## 2023-06-21 DIAGNOSIS — R072 Precordial pain: Secondary | ICD-10-CM | POA: Insufficient documentation

## 2023-06-21 LAB — MYOCARDIAL PERFUSION IMAGING
Angina Index: 0
Estimated workload: 9.4
Exercise duration (min): 7 min
Exercise duration (sec): 33 s
LV dias vol: 83 mL (ref 46–106)
LV sys vol: 29 mL
MPHR: 166 {beats}/min
Nuc Stress EF: 64 %
Peak HR: 146 {beats}/min
Percent HR: 87 %
Rest HR: 63 {beats}/min
Rest Nuclear Isotope Dose: 9.7 mCi
SDS: 1
SRS: 0
SSS: 1
Stress Nuclear Isotope Dose: 32.5 mCi
TID: 1.05

## 2023-06-21 MED ORDER — TECHNETIUM TC 99M TETROFOSMIN IV KIT
32.5000 | PACK | Freq: Once | INTRAVENOUS | Status: AC | PRN
  Administered 2023-06-21: 32.5 via INTRAVENOUS

## 2023-06-21 MED ORDER — TECHNETIUM TC 99M TETROFOSMIN IV KIT
9.7000 | PACK | Freq: Once | INTRAVENOUS | Status: AC | PRN
  Administered 2023-06-21: 9.7 via INTRAVENOUS

## 2023-06-21 NOTE — Progress Notes (Signed)
Abnormal EKG, but normal perfusion. Low suspicion for obstructive coronary artery disease. Findigns could be due to hypertension. Continue current medications.  Thanks MJP

## 2023-06-26 ENCOUNTER — Ambulatory Visit (HOSPITAL_COMMUNITY)
Admission: RE | Admit: 2023-06-26 | Discharge: 2023-06-26 | Disposition: A | Discharge status: Home / Self Care | Payer: Self-pay | Source: Ambulatory Visit | Attending: Cardiology | Admitting: Cardiology

## 2023-06-26 ENCOUNTER — Encounter: Payer: Self-pay | Admitting: Cardiology

## 2023-06-26 DIAGNOSIS — Z8249 Family history of ischemic heart disease and other diseases of the circulatory system: Secondary | ICD-10-CM | POA: Insufficient documentation

## 2023-07-03 ENCOUNTER — Ambulatory Visit (HOSPITAL_COMMUNITY): Payer: Medicaid Other | Attending: Internal Medicine

## 2023-07-03 DIAGNOSIS — R072 Precordial pain: Secondary | ICD-10-CM | POA: Diagnosis not present

## 2023-07-03 LAB — ECHOCARDIOGRAM COMPLETE
Area-P 1/2: 4.36 cm2
S' Lateral: 2.5 cm

## 2023-07-04 ENCOUNTER — Encounter: Payer: Self-pay | Admitting: Cardiology

## 2023-09-06 DIAGNOSIS — I10 Essential (primary) hypertension: Secondary | ICD-10-CM | POA: Diagnosis not present

## 2023-09-06 DIAGNOSIS — E669 Obesity, unspecified: Secondary | ICD-10-CM | POA: Diagnosis not present

## 2023-09-12 DIAGNOSIS — E559 Vitamin D deficiency, unspecified: Secondary | ICD-10-CM | POA: Diagnosis not present

## 2023-09-18 ENCOUNTER — Other Ambulatory Visit: Payer: Self-pay | Admitting: Family Medicine

## 2023-09-18 DIAGNOSIS — Z1231 Encounter for screening mammogram for malignant neoplasm of breast: Secondary | ICD-10-CM

## 2023-10-16 ENCOUNTER — Ambulatory Visit

## 2023-10-17 ENCOUNTER — Ambulatory Visit
Admission: RE | Admit: 2023-10-17 | Discharge: 2023-10-17 | Discharge status: Home / Self Care | Source: Ambulatory Visit | Attending: Family Medicine | Admitting: Family Medicine

## 2023-10-17 DIAGNOSIS — Z1231 Encounter for screening mammogram for malignant neoplasm of breast: Secondary | ICD-10-CM

## 2023-11-02 IMAGING — MG MM DIGITAL SCREENING BILAT W/ TOMO AND CAD
8 series · 8 of 24 positions shown · non-contrast
Comparison: Previous exam(s).

CLINICAL DATA: Screening.

EXAM:
DIGITAL SCREENING BILATERAL MAMMOGRAM WITH TOMOSYNTHESIS AND CAD
TECHNIQUE: Bilateral screening digital craniocaudal and mediolateral oblique
mammograms were obtained. Bilateral screening digital breast
tomosynthesis was performed. The images were evaluated with
computer-aided detection.

[L MLO synth-2D]
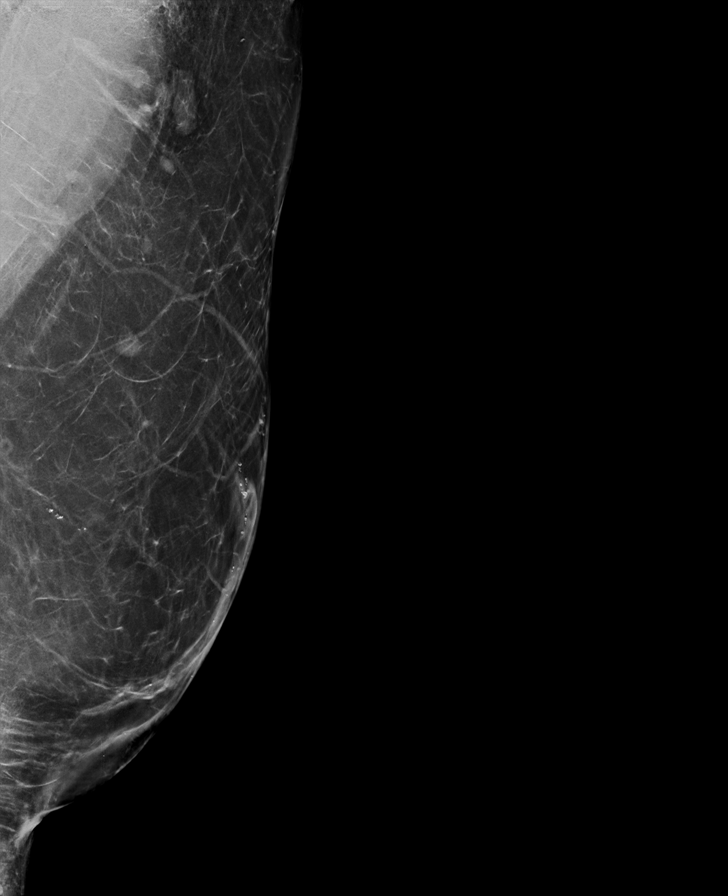

[R CC synth-2D]
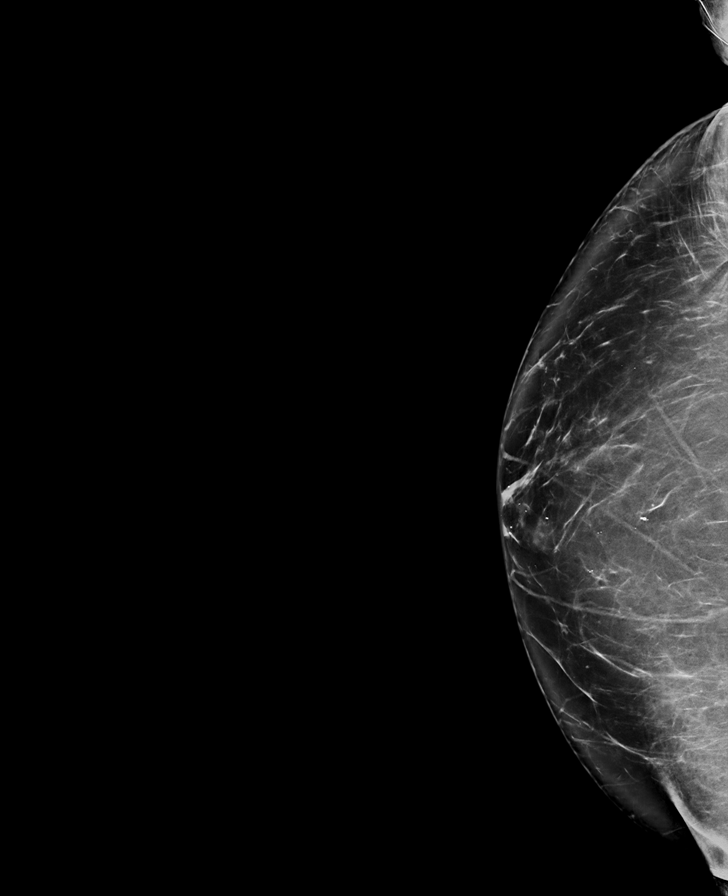

[R MLO synth-2D]
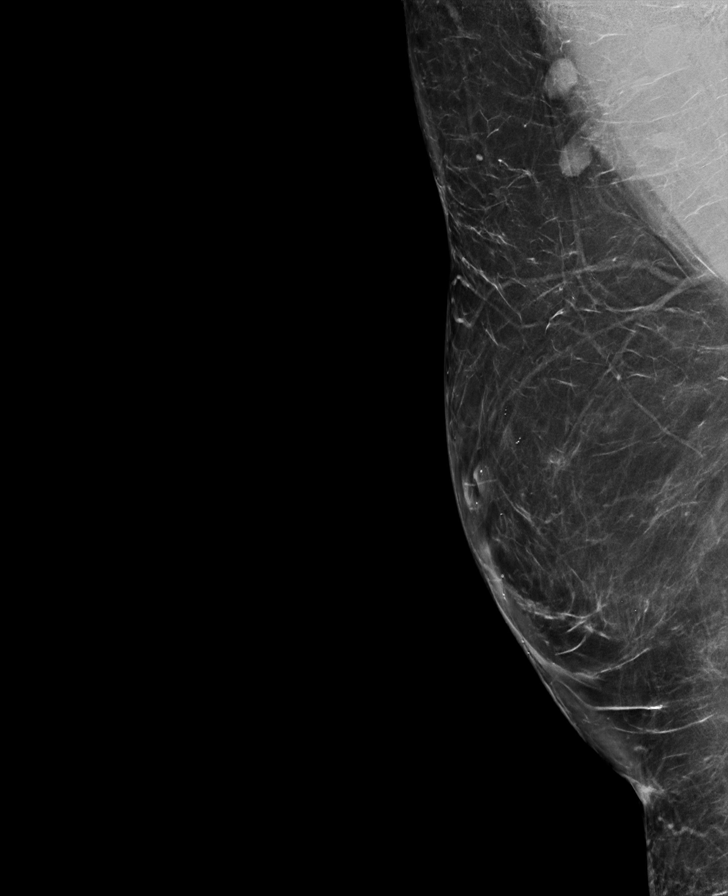

[L CC synth-2D]
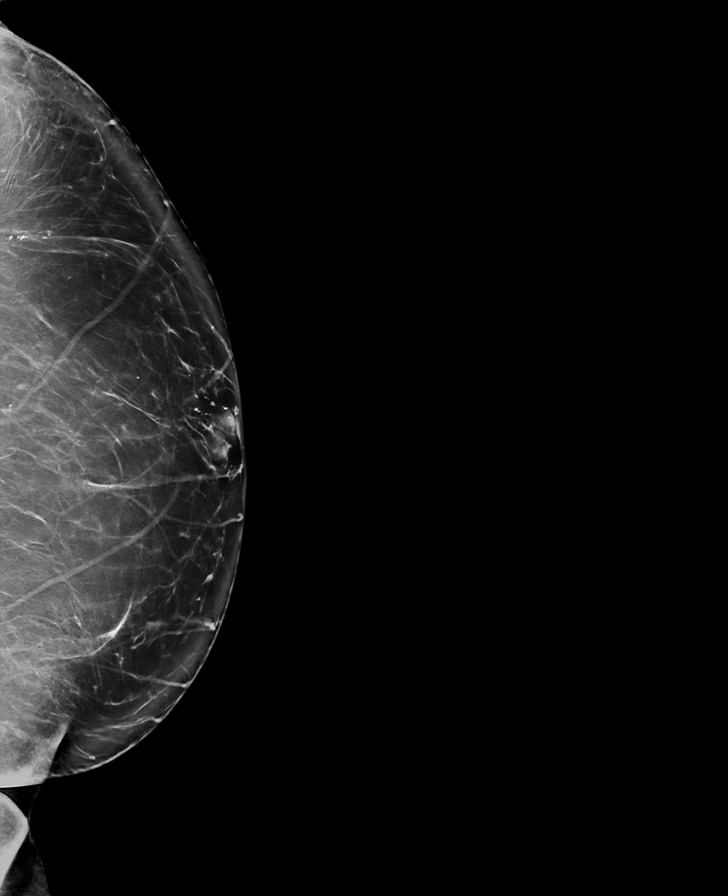

[R CC tomo · tomo slice 47/93.0]
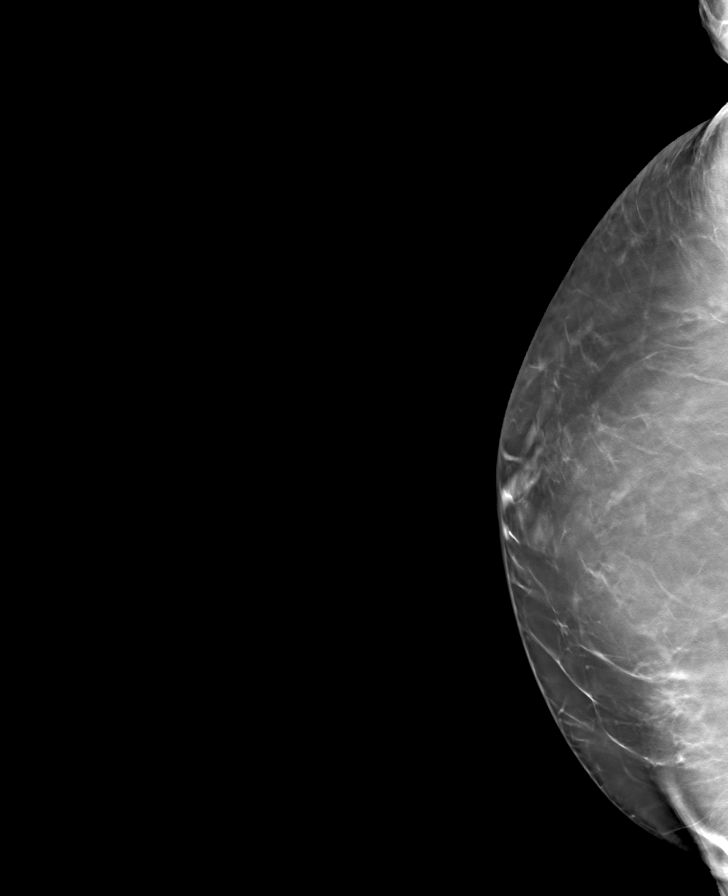

[L CC tomo · tomo slice 48/95.0]
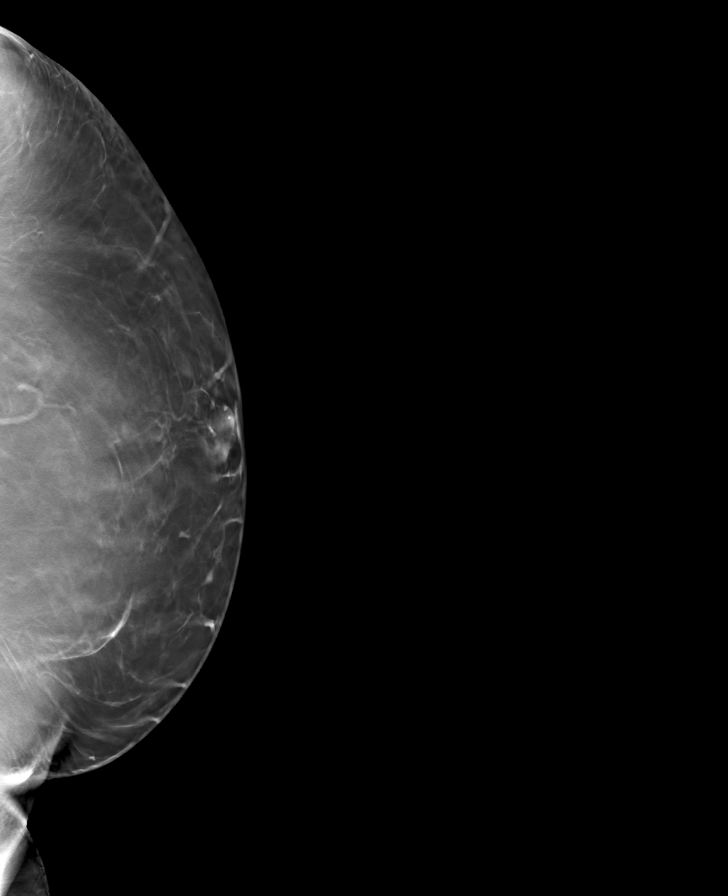

[L MLO tomo · tomo slice 49/96.0]
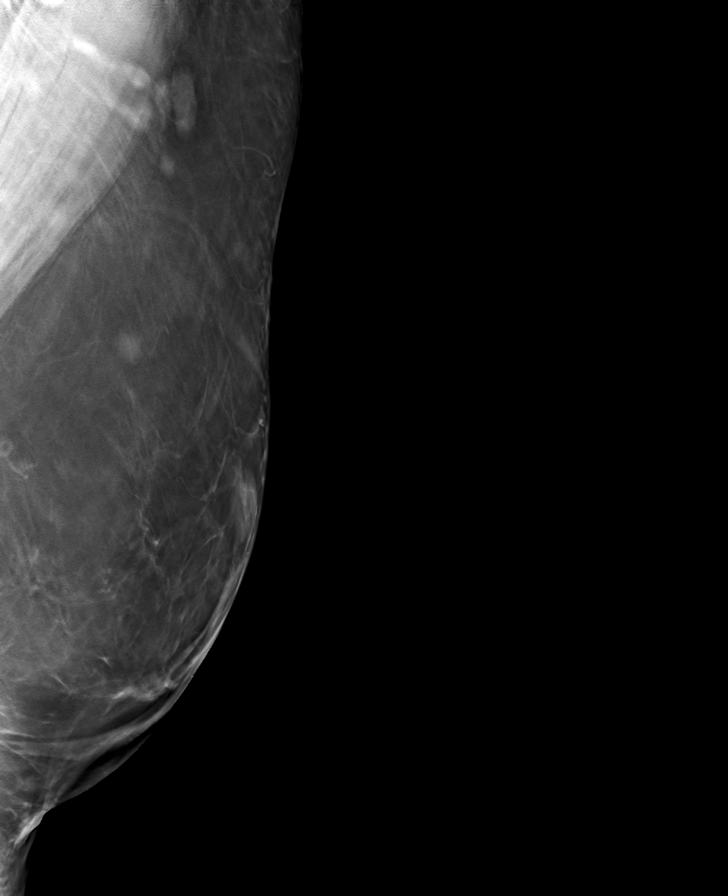

[R MLO tomo · tomo slice 46/91.0]
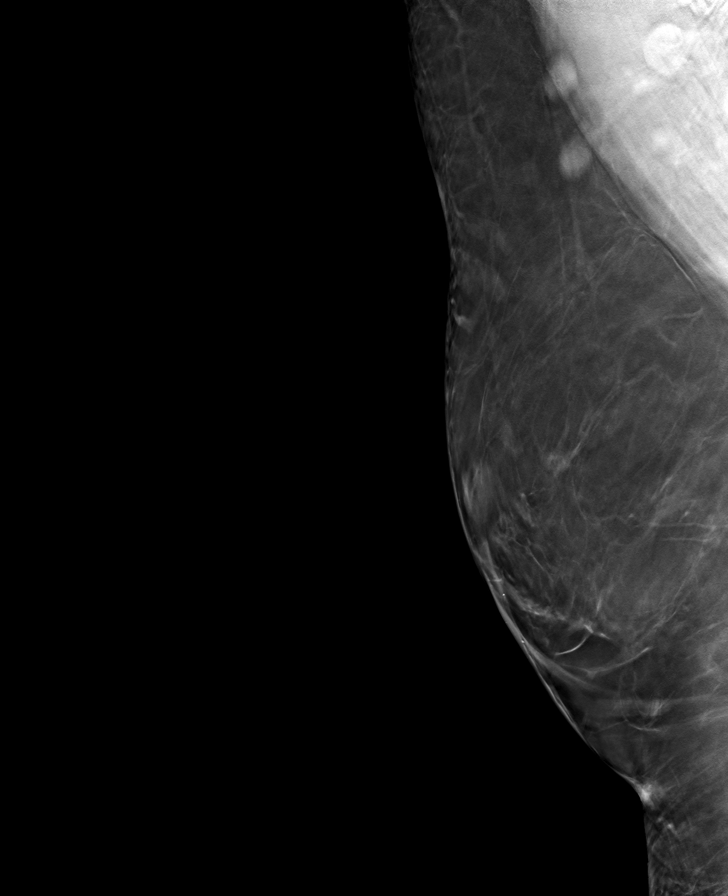

[8 of 24 positions shown; findings below may reference images not displayed]

ACR Breast Density Category b: There are scattered areas of
fibroglandular density.
FINDINGS: There are no findings suspicious for malignancy.
IMPRESSION: No mammographic evidence of malignancy. A result letter of this
screening mammogram will be mailed directly to the patient.

RECOMMENDATION:
Screening mammogram in one year. (Code:51-O-LD2)

BI-RADS CATEGORY  1: Negative.

## 2024-04-15 DIAGNOSIS — Z0000 Encounter for general adult medical examination without abnormal findings: Secondary | ICD-10-CM | POA: Diagnosis not present

## 2024-06-26 ENCOUNTER — Other Ambulatory Visit (HOSPITAL_COMMUNITY): Payer: Self-pay
